# Patient Record
Sex: Male | Born: 1990 | Race: White
Health system: Southern US, Community
[De-identification: ages and names within clinical notes are randomized; demographics above are authoritative.]

---
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
## 2020-09-30 ENCOUNTER — Emergency Department
Admission: EM | Admit: 2020-09-30 | Discharge: 2020-09-30 | Disposition: A | Discharge status: Home / Self Care | Payer: Commercial Managed Care - POS | Attending: Emergency Medicine | Admitting: Emergency Medicine

## 2020-09-30 DIAGNOSIS — R519 Headache, unspecified: Secondary | ICD-10-CM | POA: Insufficient documentation

## 2020-09-30 DIAGNOSIS — S46911A Strain of unspecified muscle, fascia and tendon at shoulder and upper arm level, right arm, initial encounter: Secondary | ICD-10-CM | POA: Insufficient documentation

## 2020-09-30 DIAGNOSIS — S161XXA Strain of muscle, fascia and tendon at neck level, initial encounter: Secondary | ICD-10-CM | POA: Insufficient documentation

## 2020-09-30 DIAGNOSIS — R030 Elevated blood-pressure reading, without diagnosis of hypertension: Secondary | ICD-10-CM | POA: Insufficient documentation

## 2020-09-30 MED ORDER — CYCLOBENZAPRINE HCL 10 MG PO TABS
10.0000 mg | ORAL_TABLET | Freq: Three times a day (TID) | ORAL | 0 refills | Status: AC | PRN
Start: 2020-09-30 — End: ?
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
